# Patient Record
Sex: Male | Born: 1995 | Race: Black or African American | Hispanic: No | Marital: Single | Smoking: Never smoker
Health system: Southern US, Community
[De-identification: ages and names within clinical notes are randomized; demographics above are authoritative.]

## PROBLEM LIST (undated history)

## (undated) ENCOUNTER — Emergency Department (HOSPITAL_BASED_OUTPATIENT_CLINIC_OR_DEPARTMENT_OTHER): Admission: EM | Discharge status: Against Medical Advice | Payer: PRIVATE HEALTH INSURANCE

## (undated) DIAGNOSIS — I10 Essential (primary) hypertension: Secondary | ICD-10-CM

## (undated) DIAGNOSIS — I517 Cardiomegaly: Secondary | ICD-10-CM

## (undated) HISTORY — PX: KNEE SURGERY: SHX244

---
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
## 2014-09-26 ENCOUNTER — Encounter (HOSPITAL_BASED_OUTPATIENT_CLINIC_OR_DEPARTMENT_OTHER): Payer: Self-pay | Admitting: *Deleted

## 2014-09-26 ENCOUNTER — Emergency Department (HOSPITAL_BASED_OUTPATIENT_CLINIC_OR_DEPARTMENT_OTHER)
Admission: EM | Admit: 2014-09-26 | Discharge: 2014-09-26 | Disposition: A | Discharge status: Home / Self Care | Payer: BLUE CROSS/BLUE SHIELD | Attending: Emergency Medicine | Admitting: Emergency Medicine

## 2014-09-26 DIAGNOSIS — S0501XA Injury of conjunctiva and corneal abrasion without foreign body, right eye, initial encounter: Secondary | ICD-10-CM | POA: Insufficient documentation

## 2014-09-26 DIAGNOSIS — Z79899 Other long term (current) drug therapy: Secondary | ICD-10-CM | POA: Insufficient documentation

## 2014-09-26 DIAGNOSIS — Y939 Activity, unspecified: Secondary | ICD-10-CM | POA: Diagnosis not present

## 2014-09-26 DIAGNOSIS — H109 Unspecified conjunctivitis: Secondary | ICD-10-CM | POA: Diagnosis not present

## 2014-09-26 DIAGNOSIS — Y999 Unspecified external cause status: Secondary | ICD-10-CM | POA: Diagnosis not present

## 2014-09-26 DIAGNOSIS — X58XXXA Exposure to other specified factors, initial encounter: Secondary | ICD-10-CM | POA: Insufficient documentation

## 2014-09-26 DIAGNOSIS — I10 Essential (primary) hypertension: Secondary | ICD-10-CM | POA: Diagnosis not present

## 2014-09-26 DIAGNOSIS — H578 Other specified disorders of eye and adnexa: Secondary | ICD-10-CM | POA: Diagnosis present

## 2014-09-26 DIAGNOSIS — Y929 Unspecified place or not applicable: Secondary | ICD-10-CM | POA: Insufficient documentation

## 2014-09-26 MED ORDER — TOBRAMYCIN-DEXAMETHASONE 0.3-0.1 % OP OINT: 1.0000 "application " | TOPICAL_OINTMENT | Freq: Three times a day (TID) | OPHTHALMIC | Status: DC

## 2014-09-26 MED ORDER — TETRACAINE HCL 0.5 % OP SOLN
1.0000 [drp] | Freq: Once | OPHTHALMIC | Status: DC
  Filled 2014-09-26: qty 2

## 2014-09-26 MED ORDER — FLUORESCEIN SODIUM 1 MG OP STRP
1.0000 | ORAL_STRIP | Freq: Once | OPHTHALMIC | Status: DC
  Filled 2014-09-26: qty 1

## 2014-09-26 NOTE — ED Provider Notes (Signed)
CSN: [id number]     Arrival date & time 09/26/14  4098 History   First MD Initiated Contact with Patient 09/26/14 [phone]     Chief Complaint  Patient presents with  . Eye Drainage     (Consider location/radiation/quality/duration/timing/severity/associated sxs/prior Treatment) Patient is a 19 y.o. male presenting with eye problem.  Eye Problem Location:  R eye Quality:  Burning Severity:  Moderate Onset quality:  Gradual Duration:  2 days Timing:  Constant Progression:  Worsening Chronicity:  New Context comment:  No foreign body Relieved by:  Nothing Worsened by:  Bright light and eye movement Associated symptoms: blurred vision, redness and tearing   Associated symptoms: no nausea, no photophobia and no vomiting     Past Medical History  Diagnosis Date  . Hypertension    History reviewed. No pertinent past surgical history. History reviewed. No pertinent family history. Social History  Substance Use Topics  . Smoking status: Never Smoker   . Smokeless tobacco: None  . Alcohol Use: No    Review of Systems  Eyes: Positive for blurred vision and redness. Negative for photophobia.  Gastrointestinal: Negative for nausea and vomiting.  All other systems reviewed and are negative.     Allergies  Lisinopril  Home Medications   Prior to Admission medications   Medication Sig Start Date End Date Taking? Authorizing Santiana Glidden  amLODipine (NORVASC) 10 MG tablet Take 10 mg by mouth daily.   Yes Historical [place] Sarinana, MD  cyclobenzaprine (FLEXERIL) 10 MG tablet Take 1 tablet (10 mg total) by mouth 2 (two) times daily as needed for muscle spasms. 06/11/12   Teressa Lower, NP  HYDROCHLOROTHIAZIDE PO Take by mouth.    Historical Taitum Menton, MD  HYDROcodone-acetaminophen (NORCO/VICODIN) 5-325 MG per tablet Take 1 tablet by mouth every 4 (four) hours as needed for pain. 06/11/12   Teressa Lower, NP  losartan (COZAAR) 100 MG tablet Take 100 mg by mouth daily.    Historical  Jordon Kristiansen, MD  tobramycin-dexamethasone Wallene Dales) ophthalmic ointment Place 1 application into the right eye 3 (three) times daily. 09/26/14   Mirian Mo, MD   BP 166/66 mmHg  Pulse 63  Temp(Src) 98.8 F (37.1 C) (Oral)  Resp 18  Ht  (1.854 m)  Wt 310 lb (140.615 kg)  BMI 40.91 kg/m2  SpO2 98% Physical Exam  Constitutional: He is oriented to person, place, and time. He appears well-developed and well-nourished.  HENT:  Head: Normocephalic and atraumatic.  Eyes: EOM are normal. Pupils are equal, round, and reactive to light. Right eye exhibits no discharge. No foreign body present in the right eye. Left eye exhibits no discharge. No foreign body present in the left eye. Right conjunctiva is injected. Right conjunctiva has no hemorrhage. Left conjunctiva is not injected. Left conjunctiva has no hemorrhage.  Slit lamp exam:      The right eye shows fluorescein uptake (diffuse punctate, as well as 3 mm circular uptake at 2 o clock over iris, with blurring of inferior portion of iris).  Neck: Normal range of motion. Neck supple.  Cardiovascular: Normal rate, regular rhythm and normal heart sounds.   Pulmonary/Chest: Effort normal and breath sounds normal. No respiratory distress.  Abdominal: He exhibits no distension. There is no tenderness. There is no rebound and no guarding.  Musculoskeletal: Normal range of motion.  Neurological: He is alert and oriented to person, place, and time.  Skin: Skin is warm and dry.  Vitals reviewed.   ED Course  Procedures (including critical care time)  Labs Review Labs Reviewed - No data to display  Imaging Review No results found. I have personally reviewed and evaluated these images and lab results as part of my medical decision-making.   EKG Interpretation None      MDM   Final diagnoses:  Conjunctivitis of right eye  Corneal abrasion, right, initial encounter    19 y.o. male with pertinent PMH of HTN presents with R eye  irritation as above.  No trauma or foreign bodies historically.  Exam as above.  Vision 20/200 od, 20/20 os.  Very mild pain with light, PERRL.  Spoke with ophthalmology Dr. Clarisa Kindred, and have arranged for fu.  DC home in stable condition with tobradex per Dr. Anne Hahn recommendation.  I have reviewed all laboratory and imaging studies if ordered as above  1. Conjunctivitis of right eye   2. Corneal abrasion, right, initial encounter         Mirian Mo, MD 09/26/14 [phone]

## 2014-09-26 NOTE — ED Notes (Signed)
Began having right eye irritation and tearing x 3 days. Denies injury (feels like something is in eye

## 2014-09-26 NOTE — Discharge Instructions (Signed)
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
## 2015-03-09 ENCOUNTER — Encounter (HOSPITAL_BASED_OUTPATIENT_CLINIC_OR_DEPARTMENT_OTHER): Payer: Self-pay | Admitting: Emergency Medicine

## 2015-03-09 ENCOUNTER — Emergency Department (HOSPITAL_BASED_OUTPATIENT_CLINIC_OR_DEPARTMENT_OTHER)
Admission: EM | Admit: 2015-03-09 | Discharge: 2015-03-09 | Disposition: A | Discharge status: Home / Self Care | Payer: BLUE CROSS/BLUE SHIELD | Attending: Emergency Medicine | Admitting: Emergency Medicine

## 2015-03-09 DIAGNOSIS — Z79899 Other long term (current) drug therapy: Secondary | ICD-10-CM | POA: Insufficient documentation

## 2015-03-09 DIAGNOSIS — J029 Acute pharyngitis, unspecified: Secondary | ICD-10-CM | POA: Diagnosis present

## 2015-03-09 DIAGNOSIS — I10 Essential (primary) hypertension: Secondary | ICD-10-CM | POA: Diagnosis not present

## 2015-03-09 DIAGNOSIS — B349 Viral infection, unspecified: Secondary | ICD-10-CM

## 2015-03-09 HISTORY — DX: Cardiomegaly: I51.7

## 2015-03-09 LAB — RAPID STREP SCREEN (MED CTR MEBANE ONLY): Streptococcus, Group A Screen (Direct): NEGATIVE

## 2015-03-09 NOTE — ED Provider Notes (Signed)
CSN: [id number]     Arrival date & time 03/09/15  1753 History   First MD Initiated Contact with Patient 03/09/15 2045     Chief Complaint  Patient presents with  . Sore Throat   (Consider location/radiation/quality/duration/timing/severity/associated sxs/prior Treatment) HPI  20 y.o. male  presents to the Emergency Department today complaining of URI symptoms x 2 days. Associated productive cough, congestion, generalized body aches. No ear aches. Notes sore throat. No N/V. Has had diarrhea. No CP/SOB/ABD pain. Notes sick contacts at school. No pain currently. Able to tolerate PO. No fevers. Has not tried any OTC remedies. No other symptoms noted.    Past Medical History  Diagnosis Date  . Hypertension   . Cardiomegaly    History reviewed. No pertinent past surgical history. No family history on file. Social History  Substance Use Topics  . Smoking status: Never Smoker   . Smokeless tobacco: None  . Alcohol Use: No    Review of Systems ROS reviewed and all are negative for acute change except as noted in the HPI.  Allergies  Lisinopril  Home Medications   Prior to Admission medications   Medication Sig Start Date End Date Taking? Authorizing Provider  amLODipine (NORVASC) 10 MG tablet Take 10 mg by mouth daily.    Historical Provider, MD  HYDROCHLOROTHIAZIDE PO Take by mouth.    Historical Provider, MD  losartan (COZAAR) 100 MG tablet Take 100 mg by mouth daily.    Historical Provider, MD   BP 163/85 mmHg  Pulse 56  Temp(Src) 99.2 F (37.3 C) (Oral)  Resp 16  Ht  (1.803 m)  Wt 142.883 kg  BMI 43.95 kg/m2  SpO2 100%   Physical Exam  Constitutional: He is oriented to person, place, and time. He appears well-developed and well-nourished.  HENT:  Head: Normocephalic and atraumatic.  Right Ear: Tympanic membrane, external ear and ear canal normal.  Left Ear: Tympanic membrane, external ear and ear canal normal.  Nose: Nose normal.  Mouth/Throat: Uvula is  midline and mucous membranes are normal. No oral lesions. No trismus in the jaw. No uvula swelling. Posterior oropharyngeal erythema present. No oropharyngeal exudate or tonsillar abscesses.  Eyes: EOM are normal. Pupils are equal, round, and reactive to light.  Neck: Normal range of motion. Neck supple. No tracheal deviation present.  Cardiovascular: Normal rate, regular rhythm and normal heart sounds.   No murmur heard. Pulmonary/Chest: Effort normal and breath sounds normal. No respiratory distress. He has no wheezes. He has no rales. He exhibits no tenderness.  Abdominal: Soft. There is no tenderness.  Musculoskeletal: Normal range of motion.  Neurological: He is alert and oriented to person, place, and time.  Skin: Skin is warm and dry.  Psychiatric: He has a normal mood and affect. His behavior is normal. Thought content normal.  Nursing note and vitals reviewed.   ED Course  Procedures (including critical care time) Labs Review Labs Reviewed  RAPID STREP SCREEN (NOT AT [facility])  CULTURE, GROUP A STREP [facility])   Imaging Review No results found. I have personally reviewed and evaluated these images and lab results as part of my medical decision-making.   EKG Interpretation None      MDM  I have reviewed and evaluated the relevant laboratory values.I have reviewed and evaluated the relevant imaging studies.I personally evaluated and interpreted the relevant EKG.I have reviewed the relevant previous healthcare records.I have reviewed EMS Documentation.I obtained HPI from historian. Patient discussed with supervising physician  ED Course:  Assessment: Pt is a 19yM presents with URI symptoms x 2 days . On exam, pt in NAD. VSS. Afebrile. Lungs CTA, Heart RRR. Abdomen nontender/soft. Strep negative. Patients symptoms are consistent with URI, likely viral etiology. Discussed that antibiotics are not indicated for viral infections. Pt will be discharged with symptomatic treatment.   Verbalizes understanding and is agreeable with plan. Pt is hemodynamically stable & in NAD prior to dc.  Disposition/Plan:   Additional Verbal discharge instructions given and discussed with patient.  Pt Instructed to f/u with PCP in the next week for evaluation and treatment of symptoms. Return precautions given Pt acknowledges and agrees with plan  Supervising Physician Laurence Spatesachel Morgan Little, MD    Final diagnoses:  Viral syndrome      Audry Piliyler Maayan Jenning, PA-C 03/09/15 2128  Laurence Spatesachel Morgan Little, MD 03/12/15 [phone]

## 2015-03-09 NOTE — ED Notes (Signed)
Sore throat, chills, sweats for two days.  Some coughing, productive.  Aches and pains.

## 2015-03-09 NOTE — Discharge Instructions (Signed)
Please read and follow all provided instructions.  Your diagnoses today include:  1. Viral syndrome    You appear to have an upper respiratory infection (URI). An upper respiratory tract infection, or cold, is a viral infection of the air passages leading to the lungs. It should improve gradually after 5-7 days. You may have a lingering cough that lasts for 2- 4 weeks after the infection.  Tests performed today include:  Vital signs. See below for your results today.   Medications prescribed:   Take any prescribed medications only as directed. Treatment for your infection is aimed at treating the symptoms. There are no medications, such as antibiotics, that will cure your infection.   Home care instructions:  Follow any educational materials contained in this packet.   Your illness is contagious and can be spread to others, especially during the first 3 or 4 days. It cannot be cured by antibiotics or other medicines. Take basic precautions such as washing your hands often, covering your mouth when you cough or sneeze, and avoiding public places where you could spread your illness to others.   Please continue drinking plenty of fluids.  Use over-the-counter medicines as needed as directed on packaging for symptom relief.  You may also use ibuprofen or tylenol as directed on packaging for pain or fever.  Do not take multiple medicines containing Tylenol or acetaminophen to avoid taking too much of this medication.  Follow-up instructions: Please follow-up with your primary care provider in the next 3 days for further evaluation of your symptoms if you are not feeling better.   Return instructions:   Please return to the Emergency Department if you experience worsening symptoms.   RETURN IMMEDIATELY IF you develop shortness of breath, confusion or altered mental status, a new rash, become dizzy, faint, or poorly responsive, or are unable to be cared for at home.  Please return if you have  persistent vomiting and cannot keep down fluids or develop a fever that is not controlled by tylenol or motrin.    Please return if you have any other emergent concerns.  Additional Information:  Your vital signs today were: BP 163/85 mmHg   Pulse 56   Temp(Src) 99.2 F (37.3 C) (Oral)   Resp 16   Ht 5\' 11"  (1.803 m)   Wt 142.883 kg   BMI 43.95 kg/m2   SpO2 100% If your blood pressure (BP) was elevated above 135/85 this visit, please have this repeated by your doctor within one month. --------------

## 2015-03-12 LAB — CULTURE, GROUP A STREP (THRC)

## 2015-03-13 ENCOUNTER — Emergency Department (HOSPITAL_BASED_OUTPATIENT_CLINIC_OR_DEPARTMENT_OTHER)
Admission: EM | Admit: 2015-03-13 | Discharge: 2015-03-13 | Disposition: A | Discharge status: Home / Self Care | Payer: BLUE CROSS/BLUE SHIELD | Attending: Emergency Medicine | Admitting: Emergency Medicine

## 2015-03-13 ENCOUNTER — Encounter (HOSPITAL_BASED_OUTPATIENT_CLINIC_OR_DEPARTMENT_OTHER): Payer: Self-pay | Admitting: *Deleted

## 2015-03-13 DIAGNOSIS — R51 Headache: Secondary | ICD-10-CM | POA: Diagnosis not present

## 2015-03-13 DIAGNOSIS — I10 Essential (primary) hypertension: Secondary | ICD-10-CM | POA: Diagnosis not present

## 2015-03-13 DIAGNOSIS — B349 Viral infection, unspecified: Secondary | ICD-10-CM | POA: Diagnosis not present

## 2015-03-13 DIAGNOSIS — R110 Nausea: Secondary | ICD-10-CM | POA: Diagnosis not present

## 2015-03-13 DIAGNOSIS — R05 Cough: Secondary | ICD-10-CM | POA: Diagnosis present

## 2015-03-13 DIAGNOSIS — R0789 Other chest pain: Secondary | ICD-10-CM | POA: Insufficient documentation

## 2015-03-13 DIAGNOSIS — Z79899 Other long term (current) drug therapy: Secondary | ICD-10-CM | POA: Diagnosis not present

## 2015-03-13 MED ORDER — AEROCHAMBER PLUS W/MASK MISC: Status: AC

## 2015-03-13 MED ORDER — ACETAMINOPHEN 500 MG PO TABS: 1000.0000 mg | ORAL_TABLET | Freq: Four times a day (QID) | ORAL | Status: AC | PRN

## 2015-03-13 MED ORDER — ALBUTEROL SULFATE HFA 108 (90 BASE) MCG/ACT IN AERS
1.0000 | INHALATION_SPRAY | Freq: Four times a day (QID) | RESPIRATORY_TRACT | Status: AC | PRN
Start: 2015-03-13 — End: ?

## 2015-03-13 NOTE — ED Notes (Signed)
Pt here on 3/7 and dx with URI.  Reports that 'they didn't give me no medicine' and that he is not feeling any better.  Reports that he continues to have body aches and chills, productive cough.

## 2015-03-13 NOTE — ED Provider Notes (Signed)
CSN: [id number]     Arrival date & time 03/13/15  1352 History   By signing my name below, I, Reginald White, attest that this documentation has been prepared under the direction and in the presence of Arby Barrette, MD. Electronically Signed: Evon Slack, ED Scribe. 03/13/2015. 3:22 PM.   Chief Complaint  Patient presents with  . URI    Patient is a 20 y.o. male presenting with URI. The history is provided by the patient. No language interpreter was used.  URI Presenting symptoms: congestion, cough, fatigue and sore throat   Presenting symptoms: no fever   Associated symptoms: headaches and myalgias    HPI Comments: Reginald White is a 20 y.o. male who presents to the Emergency Department complaining of URI symptoms x 6 days. Associated productive cough, chest tightness, fatigue, nausea (no vomiting), diarrhea (2-3 episode per day), generalized body aches, sore throat and generalized HA (waxing and waning). Pt denies any medications PTA. He states that he has been around people at school with similar symptoms. Pt states that he was diagnosed with an URI on 03/09/14 but his symptoms have not been improving.   Past Medical History  Diagnosis Date  . Hypertension   . Cardiomegaly    History reviewed. No pertinent past surgical history. History reviewed. No pertinent family history. Social History  Substance Use Topics  . Smoking status: Never Smoker   . Smokeless tobacco: None  . Alcohol Use: No    Review of Systems  Constitutional: Positive for fatigue. Negative for fever.  HENT: Positive for congestion and sore throat.   Respiratory: Positive for cough and chest tightness.   Gastrointestinal: Positive for nausea and diarrhea. Negative for vomiting.  Musculoskeletal: Positive for myalgias.  Neurological: Positive for headaches.  All other systems reviewed and are negative.    Allergies  Lisinopril  Home Medications   Prior to Admission medications   Medication Sig  Start Date End Date Taking? Authorizing Provider  acetaminophen (TYLENOL) 500 MG tablet Take 2 tablets (1,000 mg total) by mouth every 6 (six) hours as needed. 03/13/15   Arby Barrette, MD  albuterol (PROVENTIL HFA;VENTOLIN HFA) 108 (90 Base) MCG/ACT inhaler Inhale 1-2 puffs into the lungs every 6 (six) hours as needed for wheezing or shortness of breath. 03/13/15   Arby Barrette, MD  amLODipine (NORVASC) 10 MG tablet Take 10 mg by mouth daily.    Historical Provider, MD  HYDROCHLOROTHIAZIDE PO Take by mouth.    Historical Provider, MD  losartan (COZAAR) 100 MG tablet Take 100 mg by mouth daily.    Historical Provider, MD  Spacer/Aero-Holding Chambers (AEROCHAMBER PLUS WITH MASK) inhaler Use as instructed 03/13/15   Arby Barrette, MD   BP 144/81 mmHg  Pulse 64  Temp(Src) 98.3 F (36.8 C)  Resp 20  Ht 5' 11.5" (1.816 m)  Wt 315 lb (142.883 kg)  BMI 43.33 kg/m2  SpO2 100%   Physical Exam  Constitutional: He is oriented to person, place, and time. He appears well-developed and well-nourished. No distress.  HENT:  Head: Normocephalic and atraumatic.  Right Ear: Tympanic membrane normal.  Mouth/Throat: No oropharyngeal exudate or posterior oropharyngeal erythema.  Left TM occluded by cerumen.   Eyes: Conjunctivae and EOM are normal.  Neck: Neck supple. No tracheal deviation present.  Cardiovascular: Normal rate, regular rhythm and normal heart sounds.  Exam reveals no gallop and no friction rub.   No murmur heard. Pulmonary/Chest: Effort normal and breath sounds normal. No respiratory distress. He has no wheezes.  He has no rales.  Musculoskeletal: Normal range of motion. He exhibits no edema.  No calf tenderness.   Lymphadenopathy:    He has no cervical adenopathy.  Neurological: He is alert and oriented to person, place, and time.  Skin: Skin is warm and dry.  Psychiatric: He has a normal mood and affect. His behavior is normal.  Nursing note and vitals reviewed.   ED Course   Procedures (including critical care time) DIAGNOSTIC STUDIES: Oxygen Saturation is 100% on RA, normal by my interpretation.    COORDINATION OF CARE: 3:21 PM-Discussed treatment plan with pt at bedside and pt agreed to plan.     Labs Review Labs Reviewed - No data to display  Imaging Review No results found.    EKG Interpretation None      MDM   Final diagnoses:  Viral syndrome   Patient clinically has well appearance. Vital signs are stable. No tachycardia or fever. No hypotension. The patient has positive symptoms for multiple systems. Symptoms have been present for 6 days. Patient is concerned because he has not improved and because he was not given any medications for his symptoms. At this time, I intend use to find this consistent with a viral syndrome. Patient is counseled on the nature of viral syndrome and symptomatic treatment. He is advised to use Tylenol for fever and body ache. He is advised against ibuprofen due to his history of hypertension. He is also given an albuterol inhaler to use as needed for cough episodes. At this time, his lungs are clear and he has no respiratory distress. He is counseled to follow-up with the family physician.    Arby BarretteMarcy Stacey Maura, MD 03/13/15 [phone]

## 2015-03-13 NOTE — Discharge Instructions (Signed)
Viral Infections °A viral infection can be caused by different types of viruses. Most viral infections are not serious and resolve on their own. However, some infections may cause severe symptoms and may lead to further complications. °SYMPTOMS °Viruses can frequently cause: °· Minor sore throat. °· Aches and pains. °· Headaches. °· Runny nose. °· Different types of rashes. °· Watery eyes. °· Tiredness. °· Cough. °· Loss of appetite. °· Gastrointestinal infections, resulting in nausea, vomiting, and diarrhea. °These symptoms do not respond to antibiotics because the infection is not caused by bacteria. However, you might catch a bacterial infection following the viral infection. This is sometimes called a "superinfection." Symptoms of such a bacterial infection may include: °· Worsening sore throat with pus and difficulty swallowing. °· Swollen neck glands. °· Chills and a high or persistent fever. °· Severe headache. °· Tenderness over the sinuses. °· Persistent overall ill feeling (malaise), muscle aches, and tiredness (fatigue). °· Persistent cough. °· Yellow, green, or brown mucus production with coughing. °HOME CARE INSTRUCTIONS  °· Only take over-the-counter or prescription medicines for pain, discomfort, diarrhea, or fever as directed by your caregiver. °· Drink enough water and fluids to keep your urine clear or pale yellow. Sports drinks can provide valuable electrolytes, sugars, and hydration. °· Get plenty of rest and maintain proper nutrition. Soups and broths with crackers or rice are fine. °SEEK IMMEDIATE MEDICAL CARE IF:  °· You have severe headaches, shortness of breath, chest pain, neck pain, or an unusual rash. °· You have uncontrolled vomiting, diarrhea, or you are unable to keep down fluids. °· You or your child has an oral temperature above 102° F (38.9° C), not controlled by medicine. °· Your baby is older than 3 months with a rectal temperature of 102° F (38.9° C) or higher. °· Your baby is 3  months old or younger with a rectal temperature of 100.4° F (38° C) or higher. °MAKE SURE YOU:  °· Understand these instructions. °· Will watch your condition. °· Will get help right away if you are not doing well or get worse. °  °This information is not intended to replace advice given to you by your health care provider. Make sure you discuss any questions you have with your health care provider. °  °Document Released: 09/28/2004 Document Revised: 03/13/2011 Document Reviewed: 05/27/2014 °Elsevier Interactive Patient Education ©2016 [employer] °Diarrhea °Diarrhea is frequent loose and watery bowel movements. It can cause you to feel weak and dehydrated. Dehydration can cause you to become tired and thirsty, have a dry mouth, and have decreased urination that often is dark yellow. Diarrhea is a sign of another problem, most often an infection that will not last long. In most cases, diarrhea typically lasts 2-3 days. However, it can last longer if it is a sign of something more serious. It is important to treat your diarrhea as directed by your caregiver to lessen or prevent future episodes of diarrhea. °CAUSES  °Some common causes include: °· Gastrointestinal infections caused by viruses, bacteria, or parasites. °· Food poisoning or food allergies. °· Certain medicines, such as antibiotics, chemotherapy, and laxatives. °· Artificial sweeteners and fructose. °· Digestive disorders. °HOME CARE INSTRUCTIONS °· Ensure adequate fluid intake (hydration): Have 1 cup (8 oz) of fluid for each diarrhea episode. Avoid fluids that contain simple sugars or sports drinks, fruit juices, whole milk products, and sodas. Your urine should be clear or pale yellow if you are drinking enough fluids. Hydrate with an oral rehydration solution that you can   purchase at pharmacies, retail stores, and online. You can prepare an oral rehydration solution at home by mixing the following ingredients together: °¨  - tsp table salt. °¨ ¾ tsp  baking soda. °¨  tsp salt substitute containing potassium chloride. °¨ 1  tablespoons sugar. °¨ 1 L (34 oz) of water. °· Certain foods and beverages may increase the speed at which food moves through the gastrointestinal (GI) tract. These foods and beverages should be avoided and include: °¨ Caffeinated and alcoholic beverages. °¨ High-fiber foods, such as raw fruits and vegetables, nuts, seeds, and whole grain breads and cereals. °¨ Foods and beverages sweetened with sugar alcohols, such as xylitol, sorbitol, and mannitol. °· Some foods may be well tolerated and may help thicken stool including: °¨ Starchy foods, such as rice, toast, pasta, low-sugar cereal, oatmeal, grits, baked potatoes, crackers, and bagels. °¨ Bananas. °¨ Applesauce. °· Add probiotic-rich foods to help increase healthy bacteria in the GI tract, such as yogurt and fermented milk products. °· Wash your hands well after each diarrhea episode. °· Only take over-the-counter or prescription medicines as directed by your caregiver. °· Take a warm bath to relieve any burning or pain from frequent diarrhea episodes. °SEEK IMMEDIATE MEDICAL CARE IF:  °· You are unable to keep fluids down. °· You have persistent vomiting. °· You have blood in your stool, or your stools are black and tarry. °· You do not urinate in 6-8 hours, or there is only a small amount of very dark urine. °· You have abdominal pain that increases or localizes. °· You have weakness, dizziness, confusion, or light-headedness. °· You have a severe headache. °· Your diarrhea gets worse or does not get better. °· You have a fever or persistent symptoms for more than 2-3 days. °· You have a fever and your symptoms suddenly get worse. °MAKE SURE YOU:  °· Understand these instructions. °· Will watch your condition. °· Will get help right away if you are not doing well or get worse. °  °This information is not intended to replace advice given to you by your health care provider. Make sure you  discuss any questions you have with your health care provider. °  °Document Released: 12/09/2001 Document Revised: 01/09/2014 Document Reviewed: 08/27/2011 °Elsevier Interactive Patient Education ©2016 [employer] ° °

## 2015-03-18 ENCOUNTER — Telehealth (HOSPITAL_BASED_OUTPATIENT_CLINIC_OR_DEPARTMENT_OTHER): Payer: Self-pay | Admitting: Emergency Medicine

## 2015-03-18 NOTE — ED Notes (Signed)
[facility] patient stating he was seen and evaluated on 3/11 and is returning to work tonight. Pt is requesting a note stating that he can return to work tonight with todays date on note. Educated patient that I could print note but that it would have 3/11 date on it stating he could go back to work. Pt refused and stated he would call back

## 2015-12-03 ENCOUNTER — Encounter (HOSPITAL_BASED_OUTPATIENT_CLINIC_OR_DEPARTMENT_OTHER): Payer: Self-pay | Admitting: Emergency Medicine

## 2015-12-03 ENCOUNTER — Emergency Department (HOSPITAL_BASED_OUTPATIENT_CLINIC_OR_DEPARTMENT_OTHER)
Admission: EM | Admit: 2015-12-03 | Discharge: 2015-12-03 | Disposition: A | Discharge status: Home / Self Care | Payer: BLUE CROSS/BLUE SHIELD | Attending: Emergency Medicine | Admitting: Emergency Medicine

## 2015-12-03 DIAGNOSIS — Y999 Unspecified external cause status: Secondary | ICD-10-CM | POA: Insufficient documentation

## 2015-12-03 DIAGNOSIS — Y939 Activity, unspecified: Secondary | ICD-10-CM | POA: Diagnosis not present

## 2015-12-03 DIAGNOSIS — Z79899 Other long term (current) drug therapy: Secondary | ICD-10-CM | POA: Diagnosis not present

## 2015-12-03 DIAGNOSIS — I10 Essential (primary) hypertension: Secondary | ICD-10-CM | POA: Insufficient documentation

## 2015-12-03 DIAGNOSIS — Y92410 Unspecified street and highway as the place of occurrence of the external cause: Secondary | ICD-10-CM | POA: Insufficient documentation

## 2015-12-03 DIAGNOSIS — S199XXA Unspecified injury of neck, initial encounter: Secondary | ICD-10-CM | POA: Diagnosis present

## 2015-12-03 DIAGNOSIS — S161XXA Strain of muscle, fascia and tendon at neck level, initial encounter: Secondary | ICD-10-CM

## 2015-12-03 MED ORDER — CYCLOBENZAPRINE HCL 10 MG PO TABS: 10.0000 mg | ORAL_TABLET | Freq: Two times a day (BID) | ORAL | 0 refills | Status: AC | PRN

## 2015-12-03 MED ORDER — IBUPROFEN 800 MG PO TABS: 800.0000 mg | ORAL_TABLET | Freq: Three times a day (TID) | ORAL | 0 refills | Status: AC

## 2015-12-03 MED ORDER — IBUPROFEN 800 MG PO TABS
800.0000 mg | ORAL_TABLET | Freq: Once | ORAL | Status: AC
  Administered 2015-12-03: 800 mg via ORAL
  Filled 2015-12-03: qty 1
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
## 2019-01-29 ENCOUNTER — Emergency Department (HOSPITAL_BASED_OUTPATIENT_CLINIC_OR_DEPARTMENT_OTHER): Payer: BC Managed Care – PPO

## 2019-01-29 ENCOUNTER — Encounter (HOSPITAL_BASED_OUTPATIENT_CLINIC_OR_DEPARTMENT_OTHER): Payer: Self-pay | Admitting: Emergency Medicine

## 2019-01-29 ENCOUNTER — Other Ambulatory Visit: Payer: Self-pay

## 2019-01-29 ENCOUNTER — Emergency Department (HOSPITAL_BASED_OUTPATIENT_CLINIC_OR_DEPARTMENT_OTHER)
Admission: EM | Admit: 2019-01-29 | Discharge: 2019-01-29 | Disposition: A | Discharge status: Home / Self Care | Payer: BC Managed Care – PPO | Attending: Emergency Medicine | Admitting: Emergency Medicine

## 2019-01-29 DIAGNOSIS — S6010XA Contusion of unspecified finger with damage to nail, initial encounter: Secondary | ICD-10-CM

## 2019-01-29 DIAGNOSIS — S67191A Crushing injury of left index finger, initial encounter: Secondary | ICD-10-CM

## 2019-01-29 DIAGNOSIS — S6991XA Unspecified injury of right wrist, hand and finger(s), initial encounter: Secondary | ICD-10-CM | POA: Diagnosis present

## 2019-01-29 DIAGNOSIS — I10 Essential (primary) hypertension: Secondary | ICD-10-CM | POA: Diagnosis not present

## 2019-01-29 DIAGNOSIS — Z79899 Other long term (current) drug therapy: Secondary | ICD-10-CM | POA: Diagnosis not present

## 2019-01-29 DIAGNOSIS — Y999 Unspecified external cause status: Secondary | ICD-10-CM | POA: Insufficient documentation

## 2019-01-29 DIAGNOSIS — S60121A Contusion of right index finger with damage to nail, initial encounter: Secondary | ICD-10-CM | POA: Insufficient documentation

## 2019-01-29 DIAGNOSIS — W231XXA Caught, crushed, jammed, or pinched between stationary objects, initial encounter: Secondary | ICD-10-CM | POA: Insufficient documentation

## 2019-01-29 DIAGNOSIS — Y939 Activity, unspecified: Secondary | ICD-10-CM | POA: Insufficient documentation

## 2019-01-29 DIAGNOSIS — Y92810 Car as the place of occurrence of the external cause: Secondary | ICD-10-CM | POA: Insufficient documentation

## 2019-01-29 DIAGNOSIS — Z888 Allergy status to other drugs, medicaments and biological substances status: Secondary | ICD-10-CM | POA: Insufficient documentation
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
## 2019-01-29 NOTE — ED Provider Notes (Signed)
[facility] [facility] Provider Note: Georgena Spurling, MD, FACEP  CSN: [id number] MRN: [id number] ARRIVAL: 01/29/19 at [place]: [place] [facility]   HISTORY OF PRESENT ILLNESS  01/29/19 5:02 AM Reginald White is a 24 y.o. male who slammed his right index finger in a car door yesterday afternoon about 5 PM.  He has blood under that fingernail with associated pain.  He rates the pain as a 7 out of 10, worse with movement or palpation.  The pain is throbbing and kept him awake.  It has not been relieved with over-the-counter medications.   Past Medical History:  Diagnosis Date  . Cardiomegaly   . Hypertension     Past Surgical History:  Procedure Laterality Date  . KNEE SURGERY      No family history on file.  Social History   Tobacco Use  . Smoking status: Never Smoker  . Smokeless tobacco: Never Used  Substance Use Topics  . Alcohol use: No  . Drug use: No    Prior to Admission medications   Medication Sig Start Date End Date Taking? Authorizing Provider  acetaminophen (TYLENOL) 500 MG tablet Take 2 tablets (1,000 mg total) by mouth every 6 (six) hours as needed. 03/13/15   Charlesetta Shanks, MD  albuterol (PROVENTIL HFA;VENTOLIN HFA) 108 (90 Base) MCG/ACT inhaler Inhale 1-2 puffs into the lungs every 6 (six) hours as needed for wheezing or shortness of breath. 03/13/15   Charlesetta Shanks, MD  amLODipine (NORVASC) 10 MG tablet Take 10 mg by mouth daily.    [provider]  cyclobenzaprine (FLEXERIL) 10 MG tablet Take 1 tablet (10 mg total) by mouth 2 (two) times daily as needed for muscle spasms. 12/03/15   Law, Bea Graff, PA-C  HYDROCHLOROTHIAZIDE PO Take by mouth.    [provider]  ibuprofen (ADVIL,MOTRIN) 800 MG tablet Take 1 tablet (800 mg total) by mouth 3 (three) times daily. 12/03/15   Law, Bea Graff, PA-C  losartan (COZAAR) 100 MG tablet Take 100 mg by mouth daily.    [provider]  Spacer/Aero-Holding  Chambers (AEROCHAMBER PLUS WITH MASK) inhaler Use as instructed 03/13/15   Charlesetta Shanks, MD    Allergies Lisinopril   REVIEW OF SYSTEMS  Negative except as noted here or in the History of Present Illness.   PHYSICAL EXAMINATION  Initial Vital Signs Blood pressure (!) 158/83, pulse (!) 55, temperature 98 F (36.7 C), temperature source Oral, resp. rate 16, height 5\' 11"  (1.803 m), weight (!) 145.2 kg, SpO2 98 %.  Examination General: Well-developed, well-nourished male in no acute distress; appearance consistent with age of record HENT: normocephalic; atraumatic Eyes: Normal appearance Neck: supple Heart: regular rate and rhythm Lungs: clear to auscultation bilaterally Abdomen: soft; nondistended; nontender; bowel sounds present Extremities: No deformity; tenderness and subungual hematoma of right index finger distal phalanx Neurologic: Awake, alert and oriented; motor function intact in all extremities and symmetric; no facial droop Skin: Warm and dry Psychiatric: Normal mood and affect   RESULTS  Summary of this visit's results, reviewed and interpreted by myself:   EKG Interpretation  Date/Time:    Ventricular Rate:    PR Interval:    QRS Duration:   QT Interval:    QTC Calculation:   R Axis:     Text Interpretation:        Laboratory Studies: No results found for this or any previous visit (from the past 24 hour(s)). Imaging Studies: DG Finger Index Right  Result Date: 01/29/2019 CLINICAL DATA:  Crushing injury EXAM: RIGHT INDEX FINGER 2+V COMPARISON:  None. FINDINGS: Mild irregularity of the dorsal soft tissues of the base of the nail bed of the second digit. Tiny corticated fragment at the level of the second PIP is favored to reflect a small ossicle. No acute bony abnormality. Specifically, no fracture, subluxation, or dislocation. IMPRESSION: Mild irregularity of the dorsal soft tissues and of the base of the nail bed of the second digit. Likely ossicle at  the second PIP though could correlate for point tenderness. No definite acute osseous injury. Electronically Signed   By: Kreg Shropshire M.D.   On: 01/29/2019 05:41    ED COURSE and MDM  Nursing notes, initial and subsequent vitals signs, including pulse oximetry, reviewed and interpreted by myself.  Vitals:   01/29/19 0500  BP: (!) 158/83  Pulse: (!) 55  Resp: 16  Temp: 98 F (36.7 C)  TempSrc: Oral  SpO2: 98%  Weight: (!) 145.2 kg  Height: 5\' 11"  (1.803 m)   Medications - No data to display  No evidence of acute fracture on radiograph.  Patient got significant relief with nail trephination.  PROCEDURES  Procedures   NAIL TREPHINATION After informed verbal consent was obtained the patient's right index fingertip was prepped with Betadine.  A pen cautery was then used to trephinate the nail.  There was immediate release of blood and improvement in pain.  The patient tolerated this well and there were no immediate complications.  ED DIAGNOSES     ICD-10-CM   1. Crushing injury of left index finger, initial encounter  S67.191A   2. Subungual hematoma of finger of right hand, initial encounter  S60.10XA        Malissa Slay, , MD 01/29/19 [phone]

## 2021-08-01 IMAGING — DX DG FINGER INDEX 2+V*R*
3 series · 3 of 3 positions shown · non-contrast
Comparison: None.

CLINICAL DATA: Crushing injury

EXAM:
RIGHT INDEX FINGER 2+V

[finger ap]
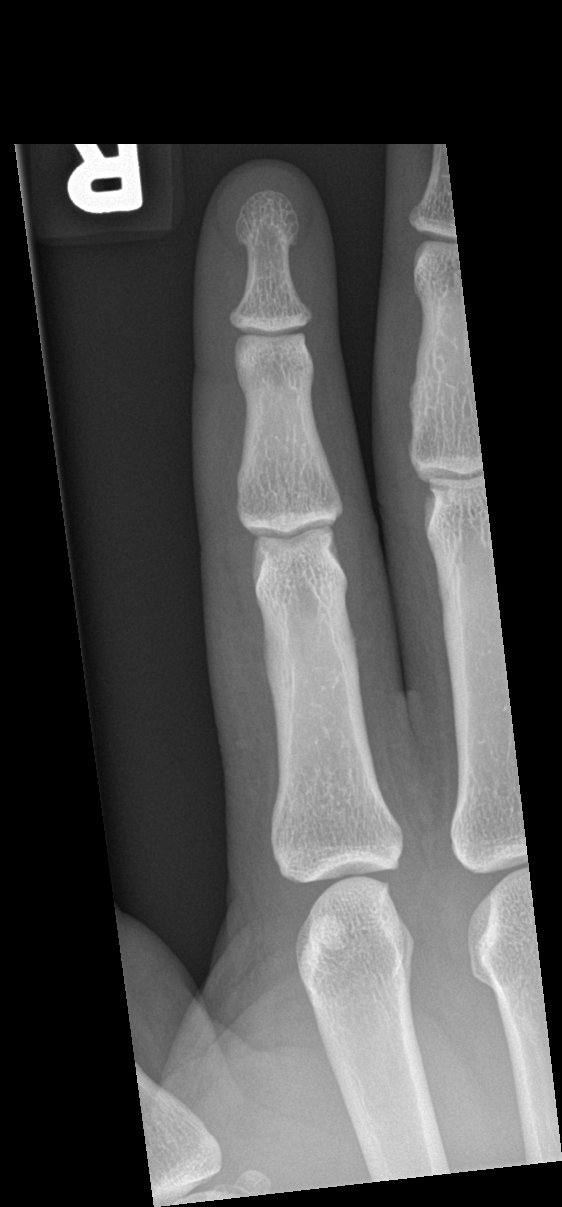

[finger obl]
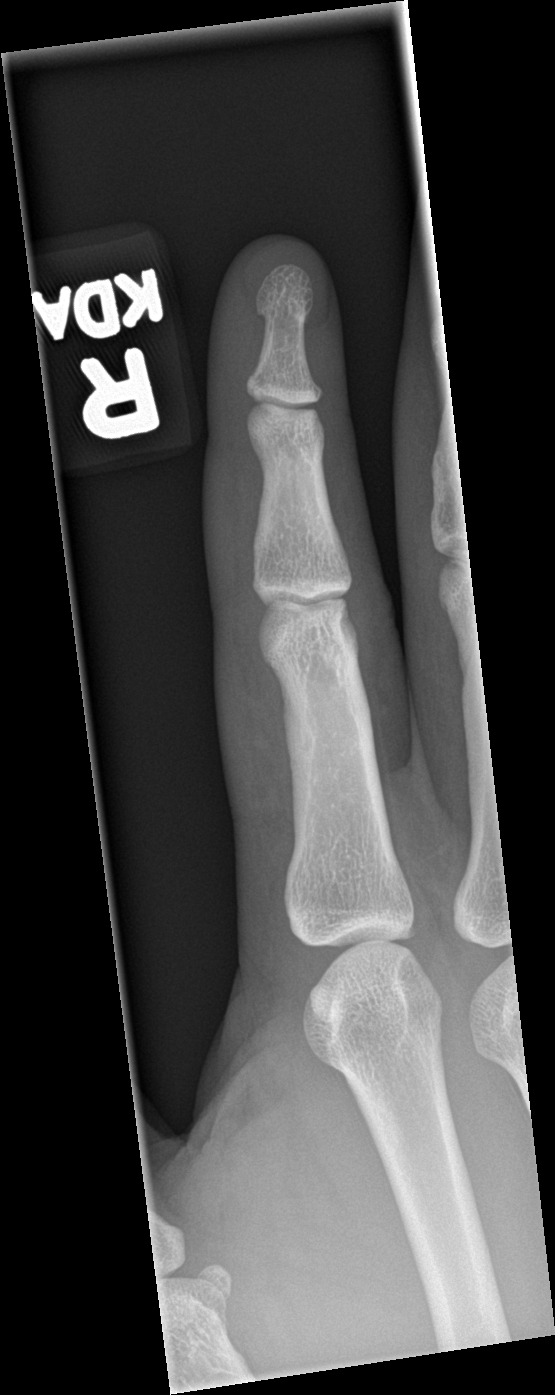

[finger lat]
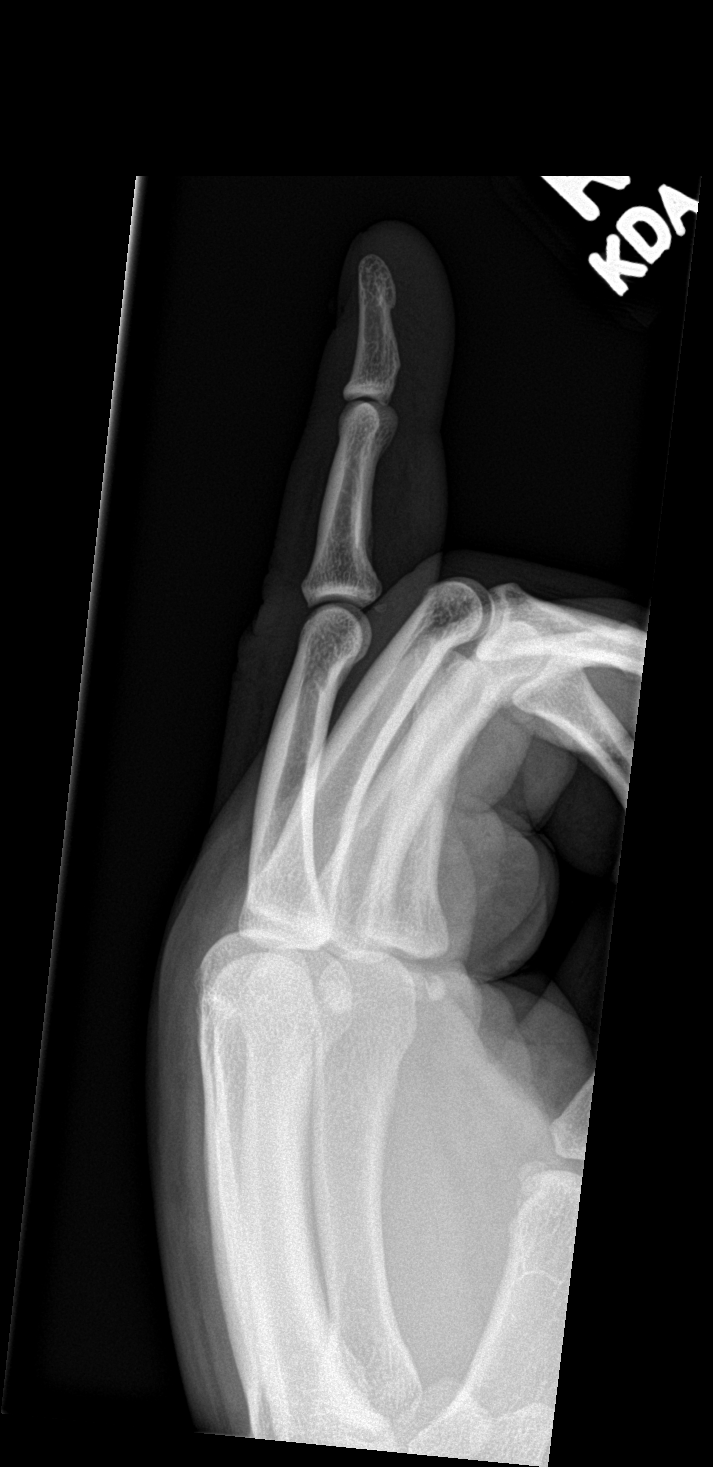

[3 of 3 positions shown; findings below may reference images not displayed]

FINDINGS: Mild irregularity of the dorsal soft tissues of the base of the nail
bed of the second digit. Tiny corticated fragment at the level of
the second PIP is favored to reflect a small ossicle. No acute bony
abnormality. Specifically, no fracture, subluxation, or dislocation.
IMPRESSION: Mild irregularity of the dorsal soft tissues and of the base of the
nail bed of the second digit.

Likely ossicle at the second PIP though could correlate for point
tenderness.

No definite acute osseous injury.
